# Patient Record
Sex: Female | Born: 1970
Health system: Southern US, Community
[De-identification: ages and names within clinical notes are randomized; demographics above are authoritative.]

## PROBLEM LIST (undated history)

## (undated) DIAGNOSIS — J302 Other seasonal allergic rhinitis: Secondary | ICD-10-CM

## (undated) DIAGNOSIS — E559 Vitamin D deficiency, unspecified: Secondary | ICD-10-CM

## (undated) DIAGNOSIS — T7840XA Allergy, unspecified, initial encounter: Secondary | ICD-10-CM

## (undated) DIAGNOSIS — J45909 Unspecified asthma, uncomplicated: Secondary | ICD-10-CM

## (undated) DIAGNOSIS — Z85828 Personal history of other malignant neoplasm of skin: Secondary | ICD-10-CM

## (undated) DIAGNOSIS — G4700 Insomnia, unspecified: Secondary | ICD-10-CM

## (undated) DIAGNOSIS — S76912A Strain of unspecified muscles, fascia and tendons at thigh level, left thigh, initial encounter: Secondary | ICD-10-CM

## (undated) DIAGNOSIS — I83891 Varicose veins of right lower extremities with other complications: Secondary | ICD-10-CM

## (undated) HISTORY — DX: Other seasonal allergic rhinitis: J30.2

## (undated) HISTORY — DX: Vitamin D deficiency, unspecified: E55.9

## (undated) HISTORY — DX: Varicose veins of right lower extremity with other complications: I83.891

## (undated) HISTORY — DX: Personal history of other malignant neoplasm of skin: Z85.828

## (undated) HISTORY — DX: Insomnia, unspecified: G47.00

## (undated) HISTORY — DX: Strain of unspecified muscles, fascia and tendons at thigh level, left thigh, initial encounter: S76.912A

## (undated) HISTORY — DX: Allergy, unspecified, initial encounter: T78.40XA

## (undated) HISTORY — DX: Unspecified asthma, uncomplicated: J45.909

---

## 2000-04-30 ENCOUNTER — Encounter: Payer: Self-pay | Admitting: Orthopedic Surgery

## 2000-04-30 ENCOUNTER — Ambulatory Visit (HOSPITAL_COMMUNITY): Admission: RE | Admit: 2000-04-30 | Discharge: 2000-04-30 | Payer: Self-pay | Admitting: Orthopedic Surgery

## 2002-06-15 ENCOUNTER — Inpatient Hospital Stay (HOSPITAL_COMMUNITY): Admission: AD | Admit: 2002-06-15 | Discharge: 2002-06-17 | Payer: Self-pay | Admitting: Obstetrics and Gynecology

## 2002-12-01 ENCOUNTER — Other Ambulatory Visit: Admission: RE | Admit: 2002-12-01 | Discharge: 2002-12-01 | Payer: Self-pay | Admitting: Obstetrics and Gynecology

## 2004-04-12 ENCOUNTER — Inpatient Hospital Stay (HOSPITAL_COMMUNITY): Admission: AD | Admit: 2004-04-12 | Discharge: 2004-04-15 | Payer: Self-pay | Admitting: Obstetrics and Gynecology

## 2004-05-26 ENCOUNTER — Other Ambulatory Visit: Admission: RE | Admit: 2004-05-26 | Discharge: 2004-05-26 | Payer: Self-pay | Admitting: Obstetrics and Gynecology

## 2004-10-31 ENCOUNTER — Ambulatory Visit (HOSPITAL_BASED_OUTPATIENT_CLINIC_OR_DEPARTMENT_OTHER): Admission: RE | Admit: 2004-10-31 | Discharge: 2004-10-31 | Payer: Self-pay | Admitting: Plastic Surgery

## 2004-10-31 ENCOUNTER — Ambulatory Visit (HOSPITAL_COMMUNITY): Admission: RE | Admit: 2004-10-31 | Discharge: 2004-10-31 | Payer: Self-pay | Admitting: Plastic Surgery

## 2005-06-29 ENCOUNTER — Other Ambulatory Visit: Admission: RE | Admit: 2005-06-29 | Discharge: 2005-06-29 | Payer: Self-pay | Admitting: Obstetrics and Gynecology

## 2012-09-23 ENCOUNTER — Other Ambulatory Visit: Payer: Self-pay

## 2014-01-12 ENCOUNTER — Ambulatory Visit
Admission: RE | Admit: 2014-01-12 | Discharge: 2014-01-12 | Disposition: A | Discharge status: Home / Self Care | Payer: 59 | Source: Ambulatory Visit | Attending: Allergy and Immunology | Admitting: Allergy and Immunology

## 2014-01-12 ENCOUNTER — Other Ambulatory Visit: Payer: Self-pay | Admitting: Allergy and Immunology

## 2014-01-12 DIAGNOSIS — J45990 Exercise induced bronchospasm: Secondary | ICD-10-CM

## 2014-07-07 ENCOUNTER — Ambulatory Visit (INDEPENDENT_AMBULATORY_CARE_PROVIDER_SITE_OTHER): Payer: 59 | Admitting: Podiatry

## 2014-07-07 ENCOUNTER — Encounter: Payer: Self-pay | Admitting: Podiatry

## 2014-07-07 ENCOUNTER — Ambulatory Visit (INDEPENDENT_AMBULATORY_CARE_PROVIDER_SITE_OTHER): Payer: 59

## 2014-07-07 VITALS — BP 108/69 | HR 63 | Resp 16

## 2014-07-07 DIAGNOSIS — M79671 Pain in right foot: Secondary | ICD-10-CM

## 2014-07-07 DIAGNOSIS — D3610 Benign neoplasm of peripheral nerves and autonomic nervous system, unspecified: Secondary | ICD-10-CM

## 2014-07-07 NOTE — Patient Instructions (Signed)
Pre-Operative Instructions  Congratulations, you have decided to take an important step to improving your quality of life.  You can be assured that the doctors of [facility] will be with you every step of the way.  1. Plan to be at the surgery center/hospital at least 1 (one) hour prior to your scheduled time unless otherwise directed by the surgical center/hospital staff.  You must have a responsible adult accompany you, remain during the surgery and drive you home.  Make sure you have directions to the surgical center/hospital and know how to get there on time. 2. For hospital based surgery you will need to obtain a history and physical form from your family physician within 1 month prior to the date of surgery- we will give you a form for you primary physician.  3. We make every effort to accommodate the date you request for surgery.  There are however, times where surgery dates or times have to be moved.  We will contact you as soon as possible if a change in schedule is required.   4. No Aspirin/Ibuprofen for one week before surgery.  If you are on aspirin, any non-steroidal anti-inflammatory medications (Mobic, Aleve, Ibuprofen) you should stop taking it 7 days prior to your surgery.  You make take Tylenol  For pain prior to surgery.  5. Medications- If you are taking daily heart and blood pressure medications, seizure, reflux, allergy, asthma, anxiety, pain or diabetes medications, make sure the surgery center/hospital is aware before the day of surgery so they may notify you which medications to take or avoid the day of surgery. 6. No food or drink after midnight the night before surgery unless directed otherwise by surgical center/hospital staff. 7. No alcoholic beverages 24 hours prior to surgery.  No smoking 24 hours prior to or 24 hours after surgery. 8. Wear loose pants or shorts- loose enough to fit over bandages, boots, and casts. 9. No slip on shoes, sneakers are best. 10. Bring  your boot with you to the surgery center/hospital.  Also bring crutches or a walker if your physician has prescribed it for you.  If you do not have this equipment, it will be provided for you after surgery. 11. If you have not been contracted by the surgery center/hospital by the day before your surgery, call to confirm the date and time of your surgery. 12. Leave-time from work may vary depending on the type of surgery you have.  Appropriate arrangements should be made prior to surgery with your employer. 13. Prescriptions will be provided immediately following surgery by your doctor.  Have these filled as soon as possible after surgery and take the medication as directed. 14. Remove nail polish on the operative foot. 15. Wash the night before surgery.  The night before surgery wash the foot and leg well with the antibacterial soap provided and water paying special attention to beneath the toenails and in between the toes.  Rinse thoroughly with water and dry well with a towel.  Perform this wash unless told not to do so by your physician.  Enclosed: 1 Ice pack (please put in freezer the night before surgery)   1 Hibiclens skin cleaner   Pre-op Instructions  If you have any questions regarding the instructions, do not hesitate to call our office.  [place]: [address] [place], [place] [address] [phone]  [place]: [address] [place], [place] [address] [phone]  [place]: [address]  [place], [place] [address] [phone]  Dr. Richard   Tuchman DPM, Dr. Norman Regal DPM Dr. Richard Sikora DPM, Dr. M. Todd Hyatt DPM, Dr. Kathryn Egerton DPM 

## 2014-07-07 NOTE — Progress Notes (Signed)
Subjective:     Patient ID: Jennifer Douglas, female   DOB: May 29, 1971, 43 y.o.   MRN: [id number]  HPI patient states that I get a lot of pain with this nerve on my right foot and that since he saw me 2-1/2 years ago it never got perfect but it really now is bothering me over the last 6 months   Review of Systems     Objective:   Physical Exam Neurovascular status intact with muscle strength adequate and range of motion within normal limits. Patient is found to have shooting pain when the third interspace right foot is pressed and the foot is squeezed with radiating discomfort into the third and fourth toes    Assessment:     Neuroma symptomatology right that has been troublesome for over 3 years    Plan:     H&P and reviewed x-rays with patient. I do think that long-term this would be best corrected and I reviewed that with Jennifer Douglas and she agrees with me. Today I did do a neuro lysis injection consisting of purified alcohol solution and Marcaine and I advised on surgery and allowed her to read a consent form for surgical correction. I explained all possible complications with surgery and reviewed the told recovery. Can take up to 6 months and there is no long-term guarantees and she will most likely have a small amount of numbness between the third and fourth toes area patient understands all of this wants surgery and signs consent form and will call to schedule in January area she is encouraged to call us with further questions

## 2014-07-14 ENCOUNTER — Telehealth: Payer: Self-pay | Admitting: *Deleted

## 2014-07-14 NOTE — Telephone Encounter (Signed)
Pt request insurance information concerning her upcoming neuroma surgery.

## 2015-11-22 ENCOUNTER — Ambulatory Visit
Admission: RE | Admit: 2015-11-22 | Discharge: 2015-11-22 | Disposition: A | Discharge status: Home / Self Care | Payer: 59 | Source: Ambulatory Visit | Attending: Family Medicine | Admitting: Family Medicine

## 2015-11-22 ENCOUNTER — Other Ambulatory Visit: Payer: Self-pay | Admitting: Family Medicine

## 2015-11-22 DIAGNOSIS — M419 Scoliosis, unspecified: Secondary | ICD-10-CM

## 2016-08-01 DIAGNOSIS — J301 Allergic rhinitis due to pollen: Secondary | ICD-10-CM | POA: Diagnosis not present

## 2016-08-01 DIAGNOSIS — J3089 Other allergic rhinitis: Secondary | ICD-10-CM | POA: Diagnosis not present

## 2016-08-01 DIAGNOSIS — C44719 Basal cell carcinoma of skin of left lower limb, including hip: Secondary | ICD-10-CM | POA: Diagnosis not present

## 2016-08-01 DIAGNOSIS — J3081 Allergic rhinitis due to animal (cat) (dog) hair and dander: Secondary | ICD-10-CM | POA: Diagnosis not present

## 2016-08-10 DIAGNOSIS — J301 Allergic rhinitis due to pollen: Secondary | ICD-10-CM | POA: Diagnosis not present

## 2016-08-10 DIAGNOSIS — J3081 Allergic rhinitis due to animal (cat) (dog) hair and dander: Secondary | ICD-10-CM | POA: Diagnosis not present

## 2016-08-10 DIAGNOSIS — J3089 Other allergic rhinitis: Secondary | ICD-10-CM | POA: Diagnosis not present

## 2016-08-15 DIAGNOSIS — J3089 Other allergic rhinitis: Secondary | ICD-10-CM | POA: Diagnosis not present

## 2016-08-15 DIAGNOSIS — J301 Allergic rhinitis due to pollen: Secondary | ICD-10-CM | POA: Diagnosis not present

## 2016-08-15 DIAGNOSIS — J3081 Allergic rhinitis due to animal (cat) (dog) hair and dander: Secondary | ICD-10-CM | POA: Diagnosis not present

## 2016-08-26 DIAGNOSIS — J00 Acute nasopharyngitis [common cold]: Secondary | ICD-10-CM | POA: Diagnosis not present

## 2016-08-30 DIAGNOSIS — J45990 Exercise induced bronchospasm: Secondary | ICD-10-CM | POA: Diagnosis not present

## 2016-08-30 DIAGNOSIS — J3081 Allergic rhinitis due to animal (cat) (dog) hair and dander: Secondary | ICD-10-CM | POA: Diagnosis not present

## 2016-08-30 DIAGNOSIS — J3089 Other allergic rhinitis: Secondary | ICD-10-CM | POA: Diagnosis not present

## 2016-08-30 DIAGNOSIS — J301 Allergic rhinitis due to pollen: Secondary | ICD-10-CM | POA: Diagnosis not present

## 2016-09-20 DIAGNOSIS — J3081 Allergic rhinitis due to animal (cat) (dog) hair and dander: Secondary | ICD-10-CM | POA: Diagnosis not present

## 2016-09-20 DIAGNOSIS — J301 Allergic rhinitis due to pollen: Secondary | ICD-10-CM | POA: Diagnosis not present

## 2016-09-20 DIAGNOSIS — J3089 Other allergic rhinitis: Secondary | ICD-10-CM | POA: Diagnosis not present

## 2016-09-29 DIAGNOSIS — J3081 Allergic rhinitis due to animal (cat) (dog) hair and dander: Secondary | ICD-10-CM | POA: Diagnosis not present

## 2016-09-29 DIAGNOSIS — J3089 Other allergic rhinitis: Secondary | ICD-10-CM | POA: Diagnosis not present

## 2016-09-29 DIAGNOSIS — J301 Allergic rhinitis due to pollen: Secondary | ICD-10-CM | POA: Diagnosis not present

## 2016-10-11 DIAGNOSIS — J3089 Other allergic rhinitis: Secondary | ICD-10-CM | POA: Diagnosis not present

## 2016-10-11 DIAGNOSIS — J3081 Allergic rhinitis due to animal (cat) (dog) hair and dander: Secondary | ICD-10-CM | POA: Diagnosis not present

## 2016-10-11 DIAGNOSIS — J301 Allergic rhinitis due to pollen: Secondary | ICD-10-CM | POA: Diagnosis not present

## 2016-10-17 DIAGNOSIS — J301 Allergic rhinitis due to pollen: Secondary | ICD-10-CM | POA: Diagnosis not present

## 2016-10-17 DIAGNOSIS — J3081 Allergic rhinitis due to animal (cat) (dog) hair and dander: Secondary | ICD-10-CM | POA: Diagnosis not present

## 2016-10-17 DIAGNOSIS — J3089 Other allergic rhinitis: Secondary | ICD-10-CM | POA: Diagnosis not present

## 2016-10-26 DIAGNOSIS — J3089 Other allergic rhinitis: Secondary | ICD-10-CM | POA: Diagnosis not present

## 2016-10-26 DIAGNOSIS — J301 Allergic rhinitis due to pollen: Secondary | ICD-10-CM | POA: Diagnosis not present

## 2016-10-26 DIAGNOSIS — J45990 Exercise induced bronchospasm: Secondary | ICD-10-CM | POA: Diagnosis not present

## 2016-10-26 DIAGNOSIS — R05 Cough: Secondary | ICD-10-CM | POA: Diagnosis not present

## 2016-11-06 DIAGNOSIS — J3089 Other allergic rhinitis: Secondary | ICD-10-CM | POA: Diagnosis not present

## 2016-11-06 DIAGNOSIS — J3081 Allergic rhinitis due to animal (cat) (dog) hair and dander: Secondary | ICD-10-CM | POA: Diagnosis not present

## 2016-11-06 DIAGNOSIS — J301 Allergic rhinitis due to pollen: Secondary | ICD-10-CM | POA: Diagnosis not present

## 2016-12-04 DIAGNOSIS — J301 Allergic rhinitis due to pollen: Secondary | ICD-10-CM | POA: Diagnosis not present

## 2016-12-04 DIAGNOSIS — J3089 Other allergic rhinitis: Secondary | ICD-10-CM | POA: Diagnosis not present

## 2016-12-04 DIAGNOSIS — J3081 Allergic rhinitis due to animal (cat) (dog) hair and dander: Secondary | ICD-10-CM | POA: Diagnosis not present

## 2016-12-05 DIAGNOSIS — L570 Actinic keratosis: Secondary | ICD-10-CM | POA: Diagnosis not present

## 2016-12-05 DIAGNOSIS — Z808 Family history of malignant neoplasm of other organs or systems: Secondary | ICD-10-CM | POA: Diagnosis not present

## 2016-12-05 DIAGNOSIS — Z86018 Personal history of other benign neoplasm: Secondary | ICD-10-CM | POA: Diagnosis not present

## 2016-12-05 DIAGNOSIS — D1800 Hemangioma unspecified site: Secondary | ICD-10-CM | POA: Diagnosis not present

## 2016-12-07 DIAGNOSIS — G4700 Insomnia, unspecified: Secondary | ICD-10-CM | POA: Diagnosis not present

## 2016-12-18 ENCOUNTER — Other Ambulatory Visit: Payer: Self-pay

## 2016-12-18 DIAGNOSIS — I8393 Asymptomatic varicose veins of bilateral lower extremities: Secondary | ICD-10-CM

## 2016-12-18 DIAGNOSIS — J3089 Other allergic rhinitis: Secondary | ICD-10-CM | POA: Diagnosis not present

## 2016-12-18 DIAGNOSIS — J3081 Allergic rhinitis due to animal (cat) (dog) hair and dander: Secondary | ICD-10-CM | POA: Diagnosis not present

## 2016-12-18 DIAGNOSIS — J301 Allergic rhinitis due to pollen: Secondary | ICD-10-CM | POA: Diagnosis not present

## 2016-12-22 DIAGNOSIS — J301 Allergic rhinitis due to pollen: Secondary | ICD-10-CM | POA: Diagnosis not present

## 2016-12-22 DIAGNOSIS — J3081 Allergic rhinitis due to animal (cat) (dog) hair and dander: Secondary | ICD-10-CM | POA: Diagnosis not present

## 2016-12-22 DIAGNOSIS — J3089 Other allergic rhinitis: Secondary | ICD-10-CM | POA: Diagnosis not present

## 2016-12-26 DIAGNOSIS — J3081 Allergic rhinitis due to animal (cat) (dog) hair and dander: Secondary | ICD-10-CM | POA: Diagnosis not present

## 2016-12-26 DIAGNOSIS — J301 Allergic rhinitis due to pollen: Secondary | ICD-10-CM | POA: Diagnosis not present

## 2016-12-26 DIAGNOSIS — J3089 Other allergic rhinitis: Secondary | ICD-10-CM | POA: Diagnosis not present

## 2017-01-02 DIAGNOSIS — J3089 Other allergic rhinitis: Secondary | ICD-10-CM | POA: Diagnosis not present

## 2017-01-02 DIAGNOSIS — J301 Allergic rhinitis due to pollen: Secondary | ICD-10-CM | POA: Diagnosis not present

## 2017-01-02 DIAGNOSIS — J3081 Allergic rhinitis due to animal (cat) (dog) hair and dander: Secondary | ICD-10-CM | POA: Diagnosis not present

## 2017-01-09 DIAGNOSIS — J3081 Allergic rhinitis due to animal (cat) (dog) hair and dander: Secondary | ICD-10-CM | POA: Diagnosis not present

## 2017-01-09 DIAGNOSIS — J3089 Other allergic rhinitis: Secondary | ICD-10-CM | POA: Diagnosis not present

## 2017-01-09 DIAGNOSIS — J301 Allergic rhinitis due to pollen: Secondary | ICD-10-CM | POA: Diagnosis not present

## 2017-01-12 DIAGNOSIS — L309 Dermatitis, unspecified: Secondary | ICD-10-CM | POA: Diagnosis not present

## 2017-01-16 DIAGNOSIS — J301 Allergic rhinitis due to pollen: Secondary | ICD-10-CM | POA: Diagnosis not present

## 2017-01-16 DIAGNOSIS — J3081 Allergic rhinitis due to animal (cat) (dog) hair and dander: Secondary | ICD-10-CM | POA: Diagnosis not present

## 2017-01-16 DIAGNOSIS — J3089 Other allergic rhinitis: Secondary | ICD-10-CM | POA: Diagnosis not present

## 2017-01-29 ENCOUNTER — Encounter: Payer: Self-pay | Admitting: Vascular Surgery

## 2017-02-01 DIAGNOSIS — J3081 Allergic rhinitis due to animal (cat) (dog) hair and dander: Secondary | ICD-10-CM | POA: Diagnosis not present

## 2017-02-01 DIAGNOSIS — J301 Allergic rhinitis due to pollen: Secondary | ICD-10-CM | POA: Diagnosis not present

## 2017-02-01 DIAGNOSIS — J3089 Other allergic rhinitis: Secondary | ICD-10-CM | POA: Diagnosis not present

## 2017-02-08 ENCOUNTER — Ambulatory Visit (HOSPITAL_COMMUNITY)
Admission: RE | Admit: 2017-02-08 | Discharge: 2017-02-08 | Disposition: A | Discharge status: Home / Self Care | Payer: 59 | Source: Ambulatory Visit | Attending: Vascular Surgery | Admitting: Vascular Surgery

## 2017-02-08 ENCOUNTER — Encounter: Payer: Self-pay | Admitting: Vascular Surgery

## 2017-02-08 ENCOUNTER — Ambulatory Visit (INDEPENDENT_AMBULATORY_CARE_PROVIDER_SITE_OTHER): Payer: 59 | Admitting: Vascular Surgery

## 2017-02-08 VITALS — BP 121/76 | HR 70 | Temp 98.3°F | Resp 14 | Ht 62.0 in | Wt 127.0 lb

## 2017-02-08 DIAGNOSIS — I8393 Asymptomatic varicose veins of bilateral lower extremities: Secondary | ICD-10-CM | POA: Diagnosis not present

## 2017-02-08 DIAGNOSIS — I872 Venous insufficiency (chronic) (peripheral): Secondary | ICD-10-CM | POA: Insufficient documentation

## 2017-02-08 NOTE — Progress Notes (Signed)
Patient name: Jennifer Douglas MRN: [id number] DOB: 01/29/1971 Sex: female   REASON FOR CONSULT:    Varicose vein. Self-referral.  HPI:   Jennifer Douglas is a pleasant 46 y.o. female,  Who noticed a bulging varicose vein on the posterior aspect of her right thigh. She presents for vascular evaluation. The patient denies any previous history of DVT or phlebitis. She is unaware of any family history of varicose veins. She has noticed some small spider veins in both lower extremities. She experiences some aching pain in her legs which is aggravated by standing and sitting. She has not had any significant swelling. She does not wear compression stockings.  She is otherwise very healthy except for history of asthma.  Past Medical History:  Diagnosis Date  . History of basal cell cancer    Right forearm (Dr Delman Cheadle)  . Insomnia   . Rupture of left rectus femoris tendon    10/2008 s/p Pt   . Seasonal allergies    Dr Criselda Peaches   . Varicose veins of right leg with edema   . Vitamin D deficiency    05/2009    Family History  Problem Relation Age of Onset  . Depression Mother   . Osteoporosis Mother   . Migraines Mother   . CVA Paternal Grandmother        tachycardia  . Arrhythmia Paternal Grandmother     SOCIAL HISTORY: Social History   Social History  . Marital status: Married    Spouse name: N/A  . Number of children: N/A  . Years of education: N/A   Occupational History  . Not on file.   Social History Main Topics  . Smoking status: Never Smoker  . Smokeless tobacco: Former Systems developer  . Alcohol use 0.6 oz/week    1 Cans of beer per week  . Drug use: No  . Sexual activity: Not on file   Other Topics Concern  . 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 No Known Allergies     Current Outpatient Prescriptions  Medication Sig Dispense Refill  . Calcium-Vitamin D (CALTRATE 600 PLUS-VIT D PO) Take by mouth.    . mometasone (ASMANEX) 220 MCG/INH inhaler Inhale 2 puffs into the lungs daily.    . Montelukast Sodium (SINGULAIR PO) Take by mouth.    . ALPRAZolam (XANAX) 0.25 MG tablet Take 0.25 mg by mouth at bedtime as needed for anxiety.    . diphenhydrAMINE (SOMINEX) 25 MG tablet Take 25 mg by mouth at bedtime as needed for sleep.    . eszopiclone (LUNESTA) 1 MG TABS tablet Take 1 mg by mouth at bedtime as needed for sleep. Take immediately before bedtime    . mometasone (NASONEX) 50 MCG/ACT nasal spray Place 2 sprays into the nose daily.    . Olopatadine HCl (PATADAY OP) Apply to eye. 1 drop in affected eye once a day     No current facility-administered medications for this visit.     REVIEW OF SYSTEMS:  [X]   denotes positive finding, [ ]  denotes negative finding Cardiac  Comments:  Chest pain or chest pressure:    Shortness of breath upon exertion:    Short of breath when lying flat:    Irregular heart rhythm:        Vascular    Pain in calf, thigh, or hip brought on by ambulation:    Pain in feet at night that wakes you up from your sleep:     Blood clot in your veins:    Leg swelling:         Pulmonary    Oxygen at home:    Productive cough:     Wheezing:         Neurologic    Sudden weakness in arms or legs:     Sudden numbness in arms or legs:     Sudden onset of difficulty speaking or slurred speech:    Temporary loss of vision in one eye:     Problems with dizziness:         Gastrointestinal    Blood in stool:     Vomited blood:         Genitourinary    Burning when urinating:     Blood in urine:        Psychiatric      Major depression:         Hematologic    Bleeding problems:    Problems with blood clotting too easily:        Skin    Rashes or ulcers:        Constitutional    Fever or chills:     PHYSICAL EXAM:   Vitals:   02/08/17 1623  BP: 121/76  Pulse: 70  Resp: 14  Temp: 98.3 F (36.8 C)  SpO2: 100%  Weight: 127 lb (57.6 kg)  Height: 5\' 2"  (1.575 m)    GENERAL: The patient is a well-nourished female, in no acute distress. The vital signs are documented above. CARDIAC: There is a regular rate and rhythm.  VASCULAR: I do not detect carotid bruits. She has palpable femoral, popliteal, and posterior tibial pulses bilaterally. She has some very small spider veins of both lower extremities and one larger varicose vein in the posterior aspect of her right thigh. There appears to be a fascial defect beneath this suggesting that this might be an incompetent perforating vein. PULMONARY: There is good air exchange bilaterally without wheezing or rales. ABDOMEN: Soft and non-tender with normal pitched bowel sounds.  MUSCULOSKELETAL: There are no major deformities or cyanosis. NEUROLOGIC: No focal weakness or paresthesias are detected. SKIN: There are no ulcers or rashes noted. PSYCHIATRIC: The patient has a normal affect.  DATA:    BILATERAL LOWER EXTREMITY VENOUS DUPLEX: I have independently interpreted her bilateral lower extremity venous duplex scan.  On the right side there is no evidence of DVT or superficial thrombophlebitis. There is deep venous reflux involving the right common femoral vein and popliteal vein. There is no superficial venous reflux.  On the left side, there is no evidence of DVT or superficial thrombophlebitis. There is deep venous reflux involving the common femoral vein. There is no superficial venous reflux.  MEDICAL ISSUES:   CHRONIC VENOUS INSUFFICIENCY: This patient has venous insufficiency that involves the deep system only. She has no significant  superficial venous reflux. I have discussed with her the importance of intermittent leg elevation and the proper positioning for this. I have written her a prescription for  knee-high compression stockings with a gradient of 15-20 mmHg. I have encouraged her to avoid prolonged sitting and standing and to exercise as much as possible. I also discussed water aerobics which is very helpful for people with venous insufficiency. If her varicose veins progress or she develops significant symptoms then we can reevaluate her in the future with a follow up venous duplex scan. She knows to call if her symptoms progress.  Deitra Mayo [facility] [phone]

## 2017-02-27 DIAGNOSIS — J3081 Allergic rhinitis due to animal (cat) (dog) hair and dander: Secondary | ICD-10-CM | POA: Diagnosis not present

## 2017-02-27 DIAGNOSIS — J3089 Other allergic rhinitis: Secondary | ICD-10-CM | POA: Diagnosis not present

## 2017-02-27 DIAGNOSIS — J301 Allergic rhinitis due to pollen: Secondary | ICD-10-CM | POA: Diagnosis not present

## 2017-03-13 DIAGNOSIS — J301 Allergic rhinitis due to pollen: Secondary | ICD-10-CM | POA: Diagnosis not present

## 2017-03-13 DIAGNOSIS — J3089 Other allergic rhinitis: Secondary | ICD-10-CM | POA: Diagnosis not present

## 2017-03-13 DIAGNOSIS — J3081 Allergic rhinitis due to animal (cat) (dog) hair and dander: Secondary | ICD-10-CM | POA: Diagnosis not present

## 2017-03-29 DIAGNOSIS — J301 Allergic rhinitis due to pollen: Secondary | ICD-10-CM | POA: Diagnosis not present

## 2017-03-29 DIAGNOSIS — J3089 Other allergic rhinitis: Secondary | ICD-10-CM | POA: Diagnosis not present

## 2017-03-29 DIAGNOSIS — J3081 Allergic rhinitis due to animal (cat) (dog) hair and dander: Secondary | ICD-10-CM | POA: Diagnosis not present

## 2017-04-03 DIAGNOSIS — Z1231 Encounter for screening mammogram for malignant neoplasm of breast: Secondary | ICD-10-CM | POA: Diagnosis not present

## 2017-04-03 DIAGNOSIS — Z01419 Encounter for gynecological examination (general) (routine) without abnormal findings: Secondary | ICD-10-CM | POA: Diagnosis not present

## 2017-04-09 DIAGNOSIS — G4700 Insomnia, unspecified: Secondary | ICD-10-CM | POA: Diagnosis not present

## 2017-04-09 DIAGNOSIS — J3089 Other allergic rhinitis: Secondary | ICD-10-CM | POA: Diagnosis not present

## 2017-04-09 DIAGNOSIS — J3081 Allergic rhinitis due to animal (cat) (dog) hair and dander: Secondary | ICD-10-CM | POA: Diagnosis not present

## 2017-04-09 DIAGNOSIS — J301 Allergic rhinitis due to pollen: Secondary | ICD-10-CM | POA: Diagnosis not present

## 2017-04-09 DIAGNOSIS — R238 Other skin changes: Secondary | ICD-10-CM | POA: Diagnosis not present

## 2017-04-23 DIAGNOSIS — J301 Allergic rhinitis due to pollen: Secondary | ICD-10-CM | POA: Diagnosis not present

## 2017-04-23 DIAGNOSIS — J45990 Exercise induced bronchospasm: Secondary | ICD-10-CM | POA: Diagnosis not present

## 2017-04-23 DIAGNOSIS — J3081 Allergic rhinitis due to animal (cat) (dog) hair and dander: Secondary | ICD-10-CM | POA: Diagnosis not present

## 2017-04-23 DIAGNOSIS — J3089 Other allergic rhinitis: Secondary | ICD-10-CM | POA: Diagnosis not present

## 2017-04-30 DIAGNOSIS — J3089 Other allergic rhinitis: Secondary | ICD-10-CM | POA: Diagnosis not present

## 2017-04-30 DIAGNOSIS — J3081 Allergic rhinitis due to animal (cat) (dog) hair and dander: Secondary | ICD-10-CM | POA: Diagnosis not present

## 2017-04-30 DIAGNOSIS — J301 Allergic rhinitis due to pollen: Secondary | ICD-10-CM | POA: Diagnosis not present

## 2017-05-22 DIAGNOSIS — J3089 Other allergic rhinitis: Secondary | ICD-10-CM | POA: Diagnosis not present

## 2017-05-22 DIAGNOSIS — J3081 Allergic rhinitis due to animal (cat) (dog) hair and dander: Secondary | ICD-10-CM | POA: Diagnosis not present

## 2017-05-22 DIAGNOSIS — J301 Allergic rhinitis due to pollen: Secondary | ICD-10-CM | POA: Diagnosis not present

## 2017-05-24 DIAGNOSIS — M7541 Impingement syndrome of right shoulder: Secondary | ICD-10-CM | POA: Diagnosis not present

## 2017-05-24 DIAGNOSIS — M7521 Bicipital tendinitis, right shoulder: Secondary | ICD-10-CM | POA: Diagnosis not present

## 2017-05-28 DIAGNOSIS — J3081 Allergic rhinitis due to animal (cat) (dog) hair and dander: Secondary | ICD-10-CM | POA: Diagnosis not present

## 2017-05-28 DIAGNOSIS — J301 Allergic rhinitis due to pollen: Secondary | ICD-10-CM | POA: Diagnosis not present

## 2017-05-29 DIAGNOSIS — J3089 Other allergic rhinitis: Secondary | ICD-10-CM | POA: Diagnosis not present

## 2017-06-06 DIAGNOSIS — M7521 Bicipital tendinitis, right shoulder: Secondary | ICD-10-CM | POA: Diagnosis not present

## 2017-06-06 DIAGNOSIS — M7541 Impingement syndrome of right shoulder: Secondary | ICD-10-CM | POA: Diagnosis not present

## 2017-06-12 DIAGNOSIS — M7521 Bicipital tendinitis, right shoulder: Secondary | ICD-10-CM | POA: Diagnosis not present

## 2017-06-12 DIAGNOSIS — M7541 Impingement syndrome of right shoulder: Secondary | ICD-10-CM | POA: Diagnosis not present

## 2017-06-19 DIAGNOSIS — J3089 Other allergic rhinitis: Secondary | ICD-10-CM | POA: Diagnosis not present

## 2017-06-19 DIAGNOSIS — J3081 Allergic rhinitis due to animal (cat) (dog) hair and dander: Secondary | ICD-10-CM | POA: Diagnosis not present

## 2017-06-19 DIAGNOSIS — J301 Allergic rhinitis due to pollen: Secondary | ICD-10-CM | POA: Diagnosis not present

## 2017-06-25 DIAGNOSIS — M7541 Impingement syndrome of right shoulder: Secondary | ICD-10-CM | POA: Diagnosis not present

## 2017-06-26 IMAGING — CR DG SCOLIOSIS EVAL COMPLETE SPINE 1V
1 series · 3 of 3 positions shown · non-contrast
Comparison: 01/12/2014

CLINICAL DATA: Scoliosis evaluation.

EXAM:
DG SCOLIOSIS EVAL COMPLETE SPINE 1V

[Series 1001: view not recorded · 0.40mm/px · 3 of 3 slices shown]
[im 1/3]
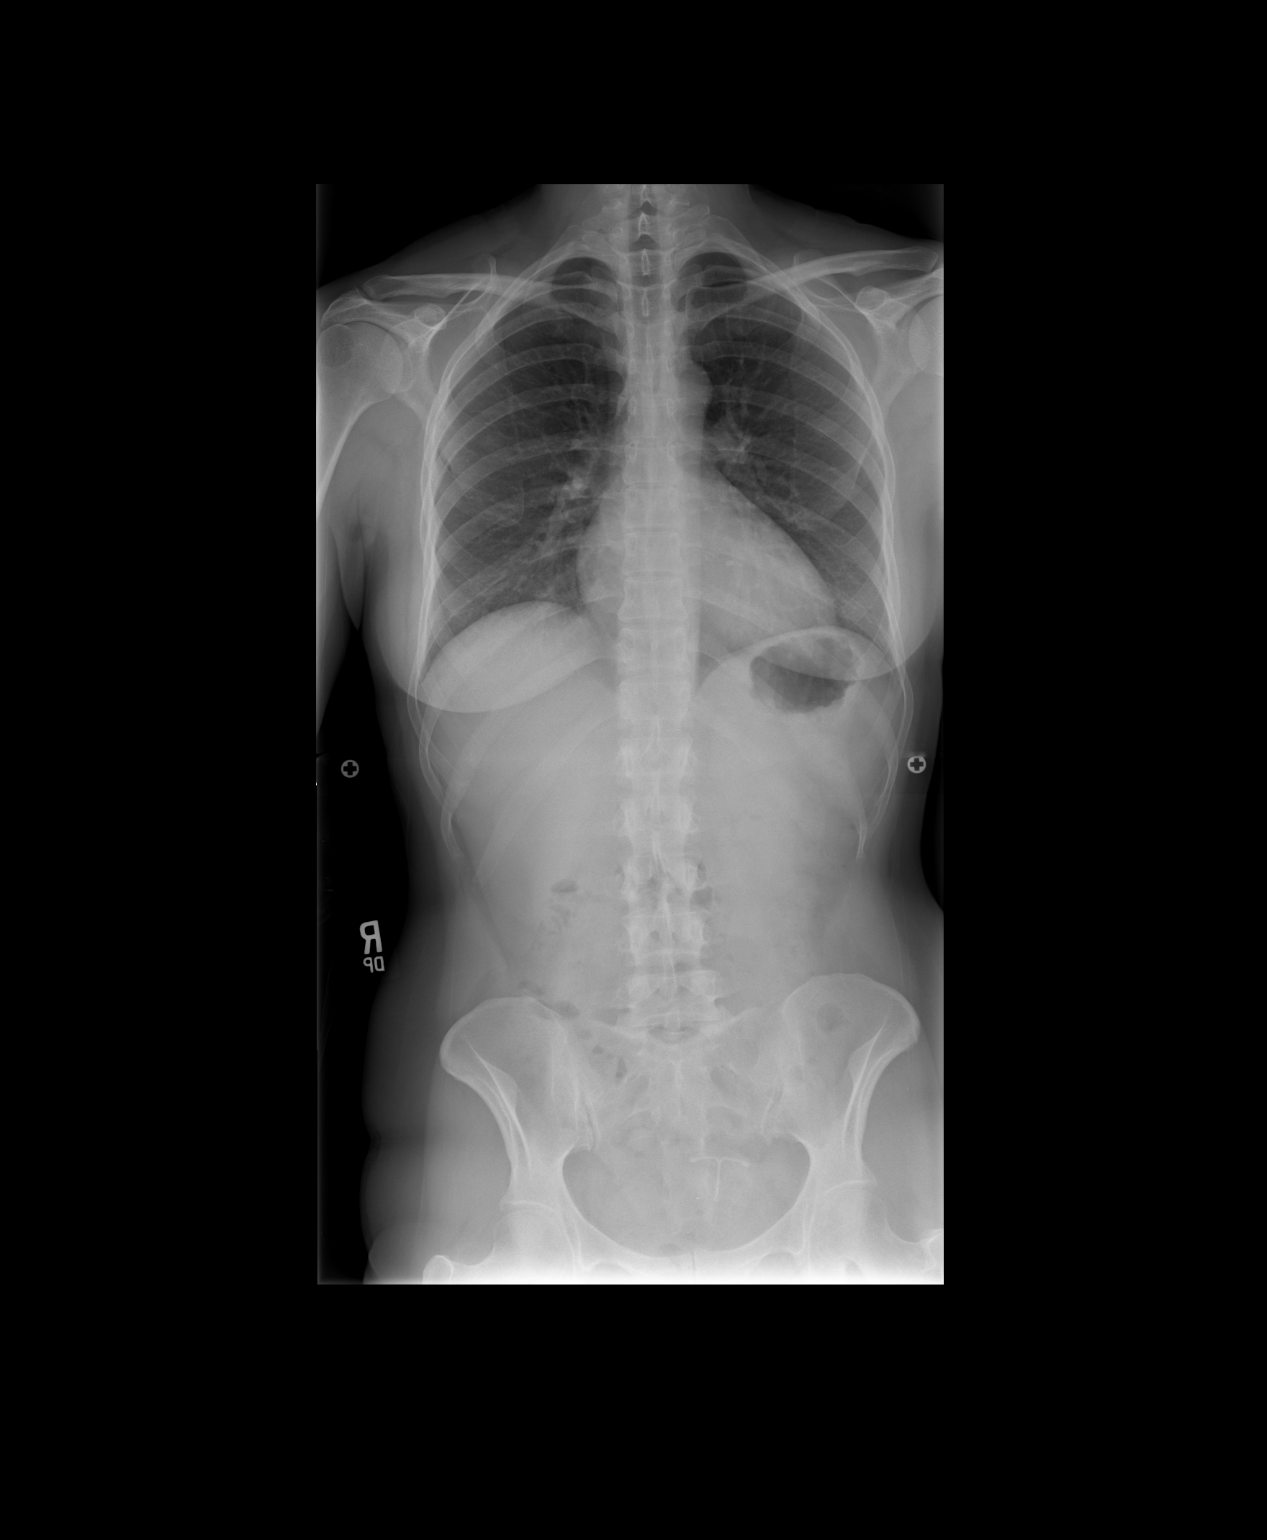
[im 2/3]
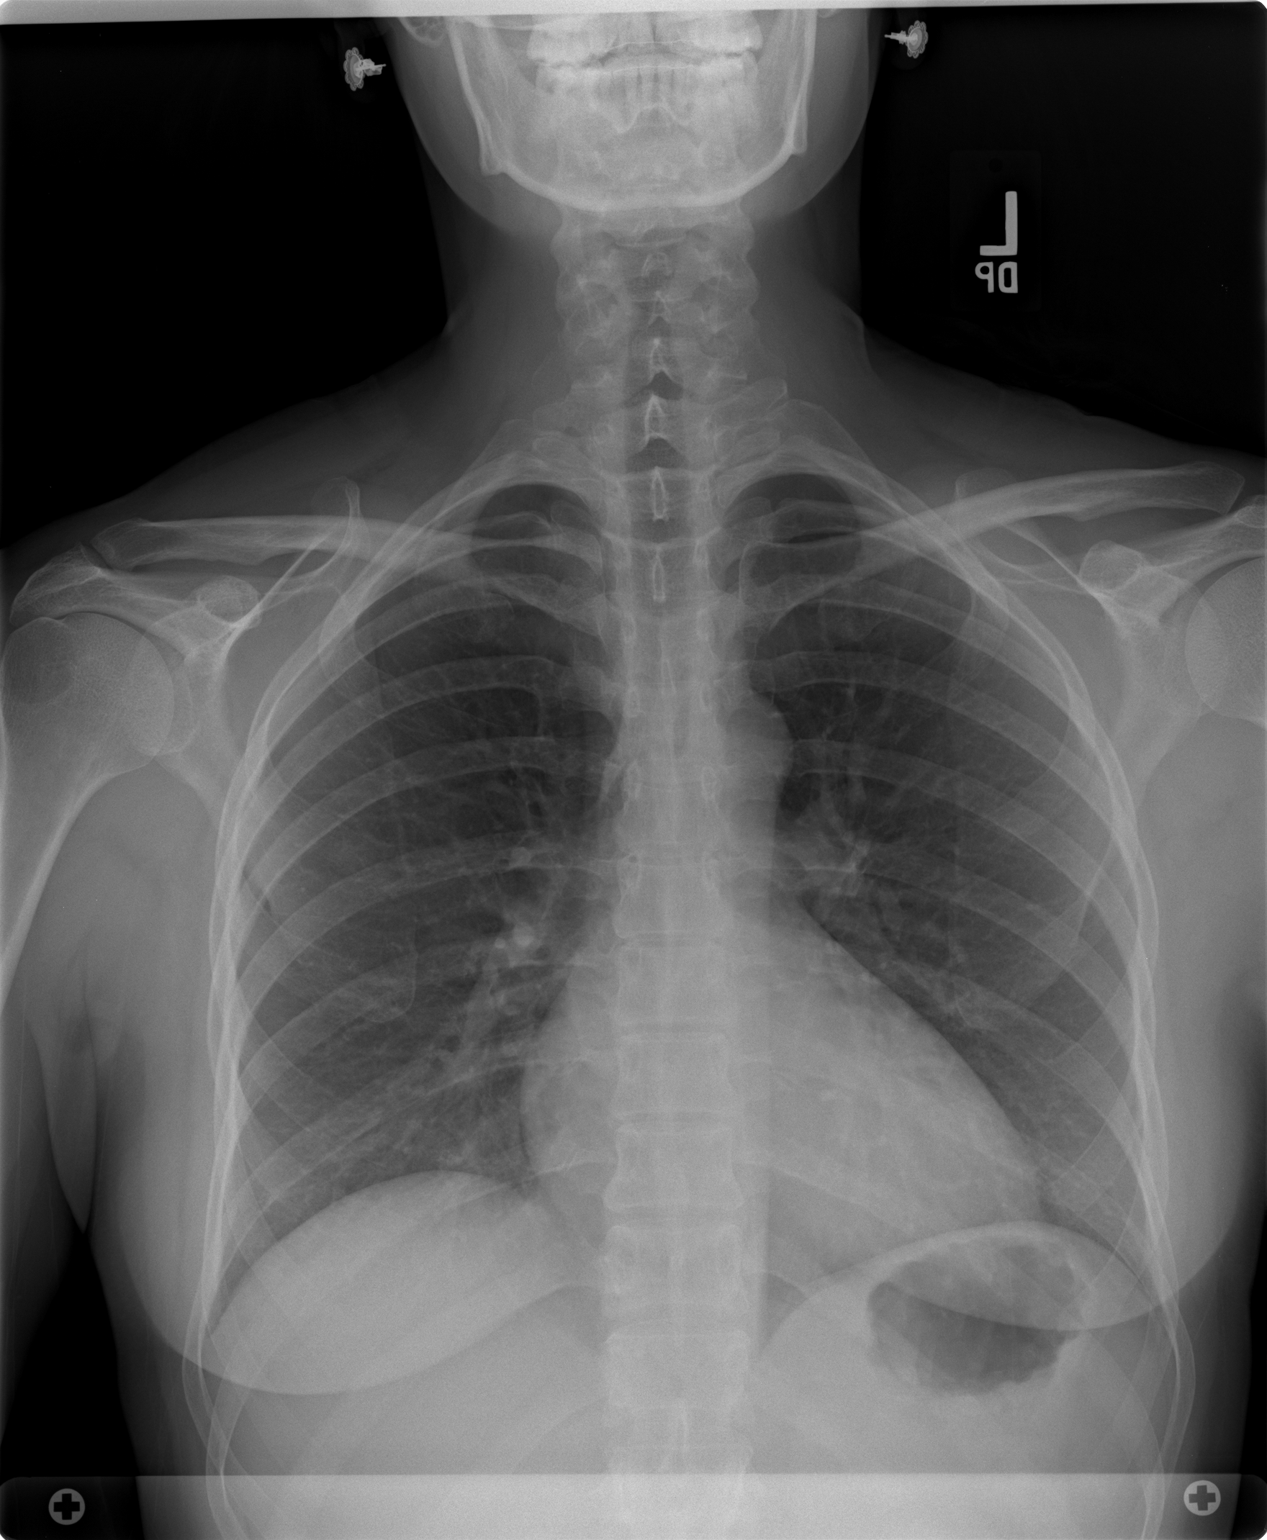
[im 3/3]
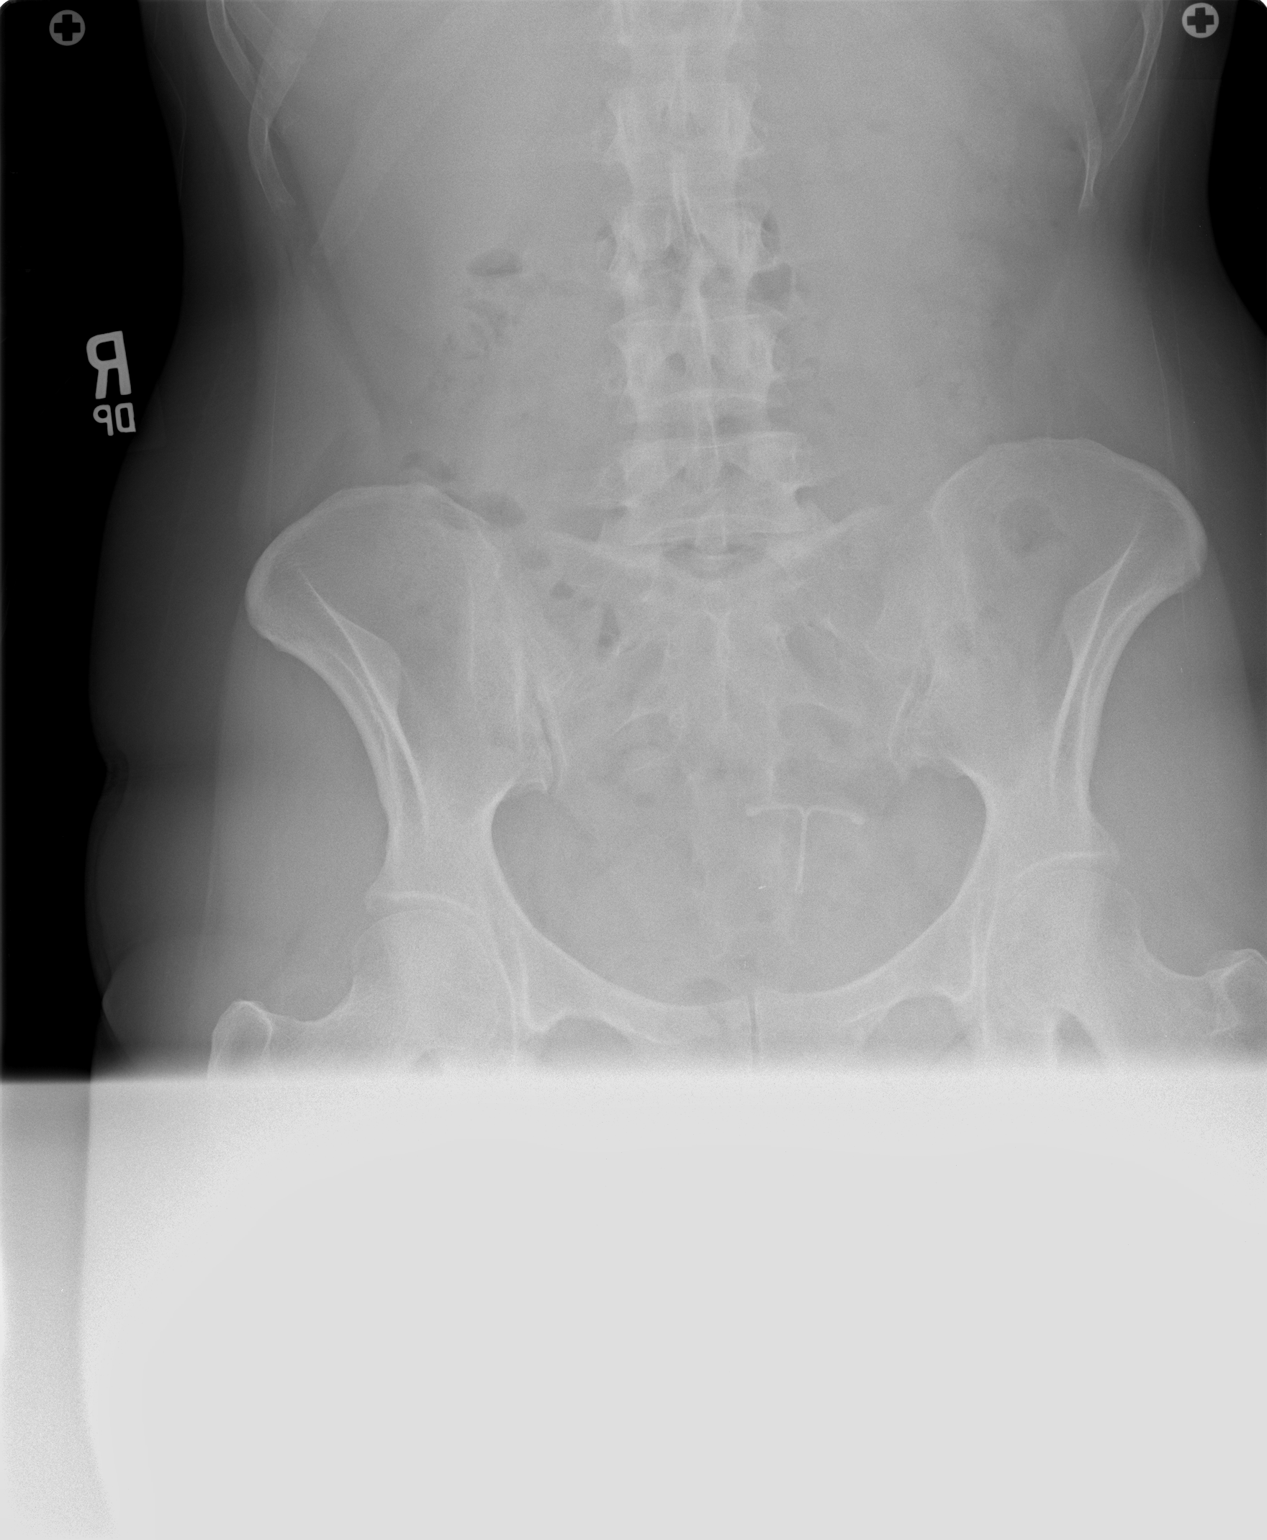

[3 of 3 positions shown; findings below may reference images not displayed]

FINDINGS: There is a slight lumbar scoliosis centered at L2 with convexity to
the right of only 4 degrees. There is a pelvic tilt. The superior
aspect of the left femoral head is 10.8 mm higher than the superior
aspect of the right femoral head. Does the patient have a leg length
discrepancy?
IMPRESSION: Minimal lumbar scoliosis. Pelvic tilt. The possibility of leg length
discrepancy should be considered.

## 2017-07-25 DIAGNOSIS — M7542 Impingement syndrome of left shoulder: Secondary | ICD-10-CM | POA: Diagnosis not present

## 2017-07-25 DIAGNOSIS — M7541 Impingement syndrome of right shoulder: Secondary | ICD-10-CM | POA: Diagnosis not present

## 2017-08-06 DIAGNOSIS — J301 Allergic rhinitis due to pollen: Secondary | ICD-10-CM | POA: Diagnosis not present

## 2017-08-06 DIAGNOSIS — J3089 Other allergic rhinitis: Secondary | ICD-10-CM | POA: Diagnosis not present

## 2017-08-06 DIAGNOSIS — J45990 Exercise induced bronchospasm: Secondary | ICD-10-CM | POA: Diagnosis not present

## 2017-08-06 DIAGNOSIS — R05 Cough: Secondary | ICD-10-CM | POA: Diagnosis not present

## 2017-09-11 DIAGNOSIS — Z23 Encounter for immunization: Secondary | ICD-10-CM | POA: Diagnosis not present

## 2017-09-11 DIAGNOSIS — Z111 Encounter for screening for respiratory tuberculosis: Secondary | ICD-10-CM | POA: Diagnosis not present

## 2017-09-17 DIAGNOSIS — Z111 Encounter for screening for respiratory tuberculosis: Secondary | ICD-10-CM | POA: Diagnosis not present

## 2017-10-02 DIAGNOSIS — J301 Allergic rhinitis due to pollen: Secondary | ICD-10-CM | POA: Diagnosis not present

## 2017-10-02 DIAGNOSIS — J3081 Allergic rhinitis due to animal (cat) (dog) hair and dander: Secondary | ICD-10-CM | POA: Diagnosis not present

## 2017-10-02 DIAGNOSIS — J3089 Other allergic rhinitis: Secondary | ICD-10-CM | POA: Diagnosis not present

## 2017-10-16 DIAGNOSIS — J3081 Allergic rhinitis due to animal (cat) (dog) hair and dander: Secondary | ICD-10-CM | POA: Diagnosis not present

## 2017-10-16 DIAGNOSIS — J301 Allergic rhinitis due to pollen: Secondary | ICD-10-CM | POA: Diagnosis not present

## 2017-10-16 DIAGNOSIS — J3089 Other allergic rhinitis: Secondary | ICD-10-CM | POA: Diagnosis not present

## 2017-10-23 DIAGNOSIS — J3089 Other allergic rhinitis: Secondary | ICD-10-CM | POA: Diagnosis not present

## 2017-10-23 DIAGNOSIS — J3081 Allergic rhinitis due to animal (cat) (dog) hair and dander: Secondary | ICD-10-CM | POA: Diagnosis not present

## 2017-10-23 DIAGNOSIS — J301 Allergic rhinitis due to pollen: Secondary | ICD-10-CM | POA: Diagnosis not present

## 2017-11-02 DIAGNOSIS — J3081 Allergic rhinitis due to animal (cat) (dog) hair and dander: Secondary | ICD-10-CM | POA: Diagnosis not present

## 2017-11-02 DIAGNOSIS — J3089 Other allergic rhinitis: Secondary | ICD-10-CM | POA: Diagnosis not present

## 2017-11-02 DIAGNOSIS — J301 Allergic rhinitis due to pollen: Secondary | ICD-10-CM | POA: Diagnosis not present

## 2017-11-05 DIAGNOSIS — J301 Allergic rhinitis due to pollen: Secondary | ICD-10-CM | POA: Diagnosis not present

## 2017-11-05 DIAGNOSIS — J3089 Other allergic rhinitis: Secondary | ICD-10-CM | POA: Diagnosis not present

## 2017-11-05 DIAGNOSIS — J3081 Allergic rhinitis due to animal (cat) (dog) hair and dander: Secondary | ICD-10-CM | POA: Diagnosis not present

## 2017-11-14 DIAGNOSIS — J3081 Allergic rhinitis due to animal (cat) (dog) hair and dander: Secondary | ICD-10-CM | POA: Diagnosis not present

## 2017-11-14 DIAGNOSIS — J3089 Other allergic rhinitis: Secondary | ICD-10-CM | POA: Diagnosis not present

## 2017-11-14 DIAGNOSIS — J301 Allergic rhinitis due to pollen: Secondary | ICD-10-CM | POA: Diagnosis not present

## 2017-11-26 DIAGNOSIS — J301 Allergic rhinitis due to pollen: Secondary | ICD-10-CM | POA: Diagnosis not present

## 2017-11-26 DIAGNOSIS — J3089 Other allergic rhinitis: Secondary | ICD-10-CM | POA: Diagnosis not present

## 2017-11-26 DIAGNOSIS — J3081 Allergic rhinitis due to animal (cat) (dog) hair and dander: Secondary | ICD-10-CM | POA: Diagnosis not present

## 2017-11-29 DIAGNOSIS — J3089 Other allergic rhinitis: Secondary | ICD-10-CM | POA: Diagnosis not present

## 2017-11-29 DIAGNOSIS — J301 Allergic rhinitis due to pollen: Secondary | ICD-10-CM | POA: Diagnosis not present

## 2017-11-29 DIAGNOSIS — J3081 Allergic rhinitis due to animal (cat) (dog) hair and dander: Secondary | ICD-10-CM | POA: Diagnosis not present

## 2017-12-03 DIAGNOSIS — J3089 Other allergic rhinitis: Secondary | ICD-10-CM | POA: Diagnosis not present

## 2017-12-03 DIAGNOSIS — J3081 Allergic rhinitis due to animal (cat) (dog) hair and dander: Secondary | ICD-10-CM | POA: Diagnosis not present

## 2017-12-03 DIAGNOSIS — J301 Allergic rhinitis due to pollen: Secondary | ICD-10-CM | POA: Diagnosis not present

## 2017-12-05 DIAGNOSIS — J301 Allergic rhinitis due to pollen: Secondary | ICD-10-CM | POA: Diagnosis not present

## 2017-12-05 DIAGNOSIS — J3081 Allergic rhinitis due to animal (cat) (dog) hair and dander: Secondary | ICD-10-CM | POA: Diagnosis not present

## 2017-12-05 DIAGNOSIS — J3089 Other allergic rhinitis: Secondary | ICD-10-CM | POA: Diagnosis not present

## 2017-12-11 DIAGNOSIS — M7541 Impingement syndrome of right shoulder: Secondary | ICD-10-CM | POA: Diagnosis not present

## 2017-12-11 DIAGNOSIS — M7542 Impingement syndrome of left shoulder: Secondary | ICD-10-CM | POA: Diagnosis not present

## 2017-12-11 DIAGNOSIS — M7501 Adhesive capsulitis of right shoulder: Secondary | ICD-10-CM | POA: Diagnosis not present

## 2017-12-13 DIAGNOSIS — J3089 Other allergic rhinitis: Secondary | ICD-10-CM | POA: Diagnosis not present

## 2017-12-13 DIAGNOSIS — J301 Allergic rhinitis due to pollen: Secondary | ICD-10-CM | POA: Diagnosis not present

## 2017-12-13 DIAGNOSIS — Z808 Family history of malignant neoplasm of other organs or systems: Secondary | ICD-10-CM | POA: Diagnosis not present

## 2017-12-13 DIAGNOSIS — D1800 Hemangioma unspecified site: Secondary | ICD-10-CM | POA: Diagnosis not present

## 2017-12-13 DIAGNOSIS — Z86018 Personal history of other benign neoplasm: Secondary | ICD-10-CM | POA: Diagnosis not present

## 2017-12-13 DIAGNOSIS — L570 Actinic keratosis: Secondary | ICD-10-CM | POA: Diagnosis not present

## 2017-12-13 DIAGNOSIS — J3081 Allergic rhinitis due to animal (cat) (dog) hair and dander: Secondary | ICD-10-CM | POA: Diagnosis not present

## 2017-12-17 DIAGNOSIS — M7501 Adhesive capsulitis of right shoulder: Secondary | ICD-10-CM | POA: Diagnosis not present

## 2017-12-17 DIAGNOSIS — M7521 Bicipital tendinitis, right shoulder: Secondary | ICD-10-CM | POA: Diagnosis not present

## 2017-12-17 DIAGNOSIS — M7541 Impingement syndrome of right shoulder: Secondary | ICD-10-CM | POA: Diagnosis not present

## 2017-12-19 DIAGNOSIS — J3081 Allergic rhinitis due to animal (cat) (dog) hair and dander: Secondary | ICD-10-CM | POA: Diagnosis not present

## 2017-12-19 DIAGNOSIS — J301 Allergic rhinitis due to pollen: Secondary | ICD-10-CM | POA: Diagnosis not present

## 2017-12-19 DIAGNOSIS — J3089 Other allergic rhinitis: Secondary | ICD-10-CM | POA: Diagnosis not present

## 2017-12-27 DIAGNOSIS — M7521 Bicipital tendinitis, right shoulder: Secondary | ICD-10-CM | POA: Diagnosis not present

## 2017-12-27 DIAGNOSIS — M7501 Adhesive capsulitis of right shoulder: Secondary | ICD-10-CM | POA: Diagnosis not present

## 2017-12-27 DIAGNOSIS — M7541 Impingement syndrome of right shoulder: Secondary | ICD-10-CM | POA: Diagnosis not present

## 2017-12-31 DIAGNOSIS — J301 Allergic rhinitis due to pollen: Secondary | ICD-10-CM | POA: Diagnosis not present

## 2017-12-31 DIAGNOSIS — J3089 Other allergic rhinitis: Secondary | ICD-10-CM | POA: Diagnosis not present

## 2017-12-31 DIAGNOSIS — J3081 Allergic rhinitis due to animal (cat) (dog) hair and dander: Secondary | ICD-10-CM | POA: Diagnosis not present

## 2018-01-02 DIAGNOSIS — M7501 Adhesive capsulitis of right shoulder: Secondary | ICD-10-CM | POA: Diagnosis not present

## 2018-01-02 DIAGNOSIS — M7521 Bicipital tendinitis, right shoulder: Secondary | ICD-10-CM | POA: Diagnosis not present

## 2018-01-02 DIAGNOSIS — J301 Allergic rhinitis due to pollen: Secondary | ICD-10-CM | POA: Diagnosis not present

## 2018-01-02 DIAGNOSIS — M7541 Impingement syndrome of right shoulder: Secondary | ICD-10-CM | POA: Diagnosis not present

## 2018-01-02 DIAGNOSIS — J3089 Other allergic rhinitis: Secondary | ICD-10-CM | POA: Diagnosis not present

## 2018-01-02 DIAGNOSIS — J3081 Allergic rhinitis due to animal (cat) (dog) hair and dander: Secondary | ICD-10-CM | POA: Diagnosis not present

## 2018-01-07 DIAGNOSIS — J301 Allergic rhinitis due to pollen: Secondary | ICD-10-CM | POA: Diagnosis not present

## 2018-01-07 DIAGNOSIS — J3089 Other allergic rhinitis: Secondary | ICD-10-CM | POA: Diagnosis not present

## 2018-01-07 DIAGNOSIS — J3081 Allergic rhinitis due to animal (cat) (dog) hair and dander: Secondary | ICD-10-CM | POA: Diagnosis not present

## 2018-01-08 DIAGNOSIS — M7501 Adhesive capsulitis of right shoulder: Secondary | ICD-10-CM | POA: Diagnosis not present

## 2018-01-08 DIAGNOSIS — M7541 Impingement syndrome of right shoulder: Secondary | ICD-10-CM | POA: Diagnosis not present

## 2018-01-08 DIAGNOSIS — M7521 Bicipital tendinitis, right shoulder: Secondary | ICD-10-CM | POA: Diagnosis not present

## 2018-01-10 DIAGNOSIS — J3089 Other allergic rhinitis: Secondary | ICD-10-CM | POA: Diagnosis not present

## 2018-01-10 DIAGNOSIS — J301 Allergic rhinitis due to pollen: Secondary | ICD-10-CM | POA: Diagnosis not present

## 2018-01-10 DIAGNOSIS — J3081 Allergic rhinitis due to animal (cat) (dog) hair and dander: Secondary | ICD-10-CM | POA: Diagnosis not present

## 2018-01-14 DIAGNOSIS — J3089 Other allergic rhinitis: Secondary | ICD-10-CM | POA: Diagnosis not present

## 2018-01-14 DIAGNOSIS — J301 Allergic rhinitis due to pollen: Secondary | ICD-10-CM | POA: Diagnosis not present

## 2018-01-14 DIAGNOSIS — J3081 Allergic rhinitis due to animal (cat) (dog) hair and dander: Secondary | ICD-10-CM | POA: Diagnosis not present

## 2018-01-16 DIAGNOSIS — J3089 Other allergic rhinitis: Secondary | ICD-10-CM | POA: Diagnosis not present

## 2018-01-16 DIAGNOSIS — J301 Allergic rhinitis due to pollen: Secondary | ICD-10-CM | POA: Diagnosis not present

## 2018-01-16 DIAGNOSIS — J3081 Allergic rhinitis due to animal (cat) (dog) hair and dander: Secondary | ICD-10-CM | POA: Diagnosis not present

## 2018-01-22 DIAGNOSIS — J3089 Other allergic rhinitis: Secondary | ICD-10-CM | POA: Diagnosis not present

## 2018-01-22 DIAGNOSIS — J301 Allergic rhinitis due to pollen: Secondary | ICD-10-CM | POA: Diagnosis not present

## 2018-01-22 DIAGNOSIS — J3081 Allergic rhinitis due to animal (cat) (dog) hair and dander: Secondary | ICD-10-CM | POA: Diagnosis not present

## 2018-01-28 DIAGNOSIS — J3081 Allergic rhinitis due to animal (cat) (dog) hair and dander: Secondary | ICD-10-CM | POA: Diagnosis not present

## 2018-01-28 DIAGNOSIS — J3089 Other allergic rhinitis: Secondary | ICD-10-CM | POA: Diagnosis not present

## 2018-01-28 DIAGNOSIS — J301 Allergic rhinitis due to pollen: Secondary | ICD-10-CM | POA: Diagnosis not present

## 2018-01-29 DIAGNOSIS — M7521 Bicipital tendinitis, right shoulder: Secondary | ICD-10-CM | POA: Diagnosis not present

## 2018-01-29 DIAGNOSIS — M7541 Impingement syndrome of right shoulder: Secondary | ICD-10-CM | POA: Diagnosis not present

## 2018-01-29 DIAGNOSIS — M7501 Adhesive capsulitis of right shoulder: Secondary | ICD-10-CM | POA: Diagnosis not present

## 2018-02-05 DIAGNOSIS — J3081 Allergic rhinitis due to animal (cat) (dog) hair and dander: Secondary | ICD-10-CM | POA: Diagnosis not present

## 2018-02-05 DIAGNOSIS — J3089 Other allergic rhinitis: Secondary | ICD-10-CM | POA: Diagnosis not present

## 2018-02-05 DIAGNOSIS — J301 Allergic rhinitis due to pollen: Secondary | ICD-10-CM | POA: Diagnosis not present

## 2018-02-20 DIAGNOSIS — J3081 Allergic rhinitis due to animal (cat) (dog) hair and dander: Secondary | ICD-10-CM | POA: Diagnosis not present

## 2018-02-20 DIAGNOSIS — J301 Allergic rhinitis due to pollen: Secondary | ICD-10-CM | POA: Diagnosis not present

## 2018-02-20 DIAGNOSIS — J45990 Exercise induced bronchospasm: Secondary | ICD-10-CM | POA: Diagnosis not present

## 2018-02-20 DIAGNOSIS — J3089 Other allergic rhinitis: Secondary | ICD-10-CM | POA: Diagnosis not present

## 2018-02-25 DIAGNOSIS — M7501 Adhesive capsulitis of right shoulder: Secondary | ICD-10-CM | POA: Diagnosis not present

## 2018-02-28 DIAGNOSIS — J3089 Other allergic rhinitis: Secondary | ICD-10-CM | POA: Diagnosis not present

## 2018-02-28 DIAGNOSIS — J301 Allergic rhinitis due to pollen: Secondary | ICD-10-CM | POA: Diagnosis not present

## 2018-02-28 DIAGNOSIS — J3081 Allergic rhinitis due to animal (cat) (dog) hair and dander: Secondary | ICD-10-CM | POA: Diagnosis not present

## 2018-03-15 DIAGNOSIS — L570 Actinic keratosis: Secondary | ICD-10-CM | POA: Diagnosis not present

## 2018-03-21 DIAGNOSIS — M7501 Adhesive capsulitis of right shoulder: Secondary | ICD-10-CM | POA: Diagnosis not present

## 2018-03-21 DIAGNOSIS — M7521 Bicipital tendinitis, right shoulder: Secondary | ICD-10-CM | POA: Diagnosis not present

## 2018-03-21 DIAGNOSIS — M7541 Impingement syndrome of right shoulder: Secondary | ICD-10-CM | POA: Diagnosis not present

## 2018-03-22 DIAGNOSIS — J301 Allergic rhinitis due to pollen: Secondary | ICD-10-CM | POA: Diagnosis not present

## 2018-03-22 DIAGNOSIS — J3089 Other allergic rhinitis: Secondary | ICD-10-CM | POA: Diagnosis not present

## 2018-03-22 DIAGNOSIS — J3081 Allergic rhinitis due to animal (cat) (dog) hair and dander: Secondary | ICD-10-CM | POA: Diagnosis not present

## 2018-04-05 DIAGNOSIS — M7541 Impingement syndrome of right shoulder: Secondary | ICD-10-CM | POA: Diagnosis not present

## 2018-04-05 DIAGNOSIS — M7521 Bicipital tendinitis, right shoulder: Secondary | ICD-10-CM | POA: Diagnosis not present

## 2018-04-05 DIAGNOSIS — M7501 Adhesive capsulitis of right shoulder: Secondary | ICD-10-CM | POA: Diagnosis not present

## 2018-04-09 DIAGNOSIS — J301 Allergic rhinitis due to pollen: Secondary | ICD-10-CM | POA: Diagnosis not present

## 2018-04-09 DIAGNOSIS — J3081 Allergic rhinitis due to animal (cat) (dog) hair and dander: Secondary | ICD-10-CM | POA: Diagnosis not present

## 2018-04-09 DIAGNOSIS — J3089 Other allergic rhinitis: Secondary | ICD-10-CM | POA: Diagnosis not present

## 2018-04-22 DIAGNOSIS — M7501 Adhesive capsulitis of right shoulder: Secondary | ICD-10-CM | POA: Diagnosis not present

## 2018-05-01 DIAGNOSIS — J301 Allergic rhinitis due to pollen: Secondary | ICD-10-CM | POA: Diagnosis not present

## 2018-05-01 DIAGNOSIS — J3089 Other allergic rhinitis: Secondary | ICD-10-CM | POA: Diagnosis not present

## 2018-05-01 DIAGNOSIS — J3081 Allergic rhinitis due to animal (cat) (dog) hair and dander: Secondary | ICD-10-CM | POA: Diagnosis not present

## 2018-05-03 DIAGNOSIS — J301 Allergic rhinitis due to pollen: Secondary | ICD-10-CM | POA: Diagnosis not present

## 2018-05-03 DIAGNOSIS — Z23 Encounter for immunization: Secondary | ICD-10-CM | POA: Diagnosis not present

## 2018-05-03 DIAGNOSIS — J3081 Allergic rhinitis due to animal (cat) (dog) hair and dander: Secondary | ICD-10-CM | POA: Diagnosis not present

## 2018-05-06 DIAGNOSIS — J3089 Other allergic rhinitis: Secondary | ICD-10-CM | POA: Diagnosis not present

## 2018-05-07 DIAGNOSIS — J45990 Exercise induced bronchospasm: Secondary | ICD-10-CM | POA: Diagnosis not present

## 2018-05-07 DIAGNOSIS — J301 Allergic rhinitis due to pollen: Secondary | ICD-10-CM | POA: Diagnosis not present

## 2018-05-07 DIAGNOSIS — R05 Cough: Secondary | ICD-10-CM | POA: Diagnosis not present

## 2018-05-07 DIAGNOSIS — J3089 Other allergic rhinitis: Secondary | ICD-10-CM | POA: Diagnosis not present

## 2018-05-27 DIAGNOSIS — J3081 Allergic rhinitis due to animal (cat) (dog) hair and dander: Secondary | ICD-10-CM | POA: Diagnosis not present

## 2018-05-27 DIAGNOSIS — J3089 Other allergic rhinitis: Secondary | ICD-10-CM | POA: Diagnosis not present

## 2018-05-27 DIAGNOSIS — J301 Allergic rhinitis due to pollen: Secondary | ICD-10-CM | POA: Diagnosis not present

## 2018-06-12 DIAGNOSIS — G4700 Insomnia, unspecified: Secondary | ICD-10-CM | POA: Diagnosis not present

## 2018-06-12 DIAGNOSIS — Z6823 Body mass index (BMI) 23.0-23.9, adult: Secondary | ICD-10-CM | POA: Diagnosis not present

## 2018-07-22 DIAGNOSIS — J301 Allergic rhinitis due to pollen: Secondary | ICD-10-CM | POA: Diagnosis not present

## 2018-07-22 DIAGNOSIS — J0100 Acute maxillary sinusitis, unspecified: Secondary | ICD-10-CM | POA: Diagnosis not present

## 2018-07-22 DIAGNOSIS — H1045 Other chronic allergic conjunctivitis: Secondary | ICD-10-CM | POA: Diagnosis not present

## 2018-07-22 DIAGNOSIS — J3089 Other allergic rhinitis: Secondary | ICD-10-CM | POA: Diagnosis not present

## 2018-07-22 DIAGNOSIS — J45990 Exercise induced bronchospasm: Secondary | ICD-10-CM | POA: Diagnosis not present

## 2019-07-01 ENCOUNTER — Ambulatory Visit (INDEPENDENT_AMBULATORY_CARE_PROVIDER_SITE_OTHER): Payer: 59 | Admitting: Plastic Surgery

## 2019-07-01 ENCOUNTER — Other Ambulatory Visit: Payer: Self-pay

## 2019-07-01 ENCOUNTER — Encounter: Payer: Self-pay | Admitting: Plastic Surgery

## 2019-07-01 DIAGNOSIS — R252 Cramp and spasm: Secondary | ICD-10-CM | POA: Insufficient documentation

## 2019-07-01 NOTE — Progress Notes (Signed)
Patient ID: Jennifer Douglas, female    DOB: 02/02/1971, 48 y.o.   MRN: [id number]   Chief Complaint  Patient presents with  . Advice Only    The patient is a 48 year old female here for evaluation of her trismus.  She has been dealing with this for over a year.  She had splints made by her dentist.  She denies any grinding.  She states that it is becoming quite bothersome to her.  She feels the tightness in her face and in her cheek muscles.  She has been under a lot of stress with Covid but not particularly anything more than usual.  She has done some research and is interested in botulinum toxin treatment.  She is otherwise in good health and not had any traumatic injury to the area.   Review of Systems  Constitutional: Negative.   HENT: Negative.   Eyes: Negative.   Respiratory: Negative.   Cardiovascular: Negative.   Gastrointestinal: Negative.   Endocrine: Negative.   Genitourinary: Negative.   Musculoskeletal: Negative.   Skin: Negative.   Neurological: Negative.   Psychiatric/Behavioral: Negative.     Past Medical History:  Diagnosis Date  . History of basal cell cancer    Right forearm (Dr Delman Cheadle)  . Insomnia   . Rupture of left rectus femoris tendon    10/2008 s/p Pt   . Seasonal allergies    Dr Criselda Peaches   . Varicose veins of right leg with edema   . Vitamin D deficiency    05/2009    History reviewed. No pertinent surgical history.    Current Outpatient Medications:  .  ALPRAZolam (XANAX) 0.25 MG tablet, Take 0.25 mg by mouth at bedtime as needed for anxiety., Disp: , Rfl:  .  Calcium-Vitamin D (CALTRATE 600 PLUS-VIT D PO), Take by mouth., Disp: , Rfl:  .  diphenhydrAMINE (SOMINEX) 25 MG tablet, Take 25 mg by mouth at bedtime as needed for sleep., Disp: , Rfl:  .  eszopiclone (LUNESTA) 1 MG TABS tablet, Take 1 mg by mouth at bedtime as needed for sleep. Take immediately before bedtime, Disp: , Rfl:  .  mometasone (ASMANEX) 220 MCG/INH inhaler, Inhale 2  puffs into the lungs daily., Disp: , Rfl:  .  mometasone (NASONEX) 50 MCG/ACT nasal spray, Place 2 sprays into the nose daily., Disp: , Rfl:  .  Montelukast Sodium (SINGULAIR PO), Take by mouth., Disp: , Rfl:  .  Olopatadine HCl (PATADAY OP), Apply to eye. 1 drop in affected eye once a day, Disp: , Rfl:    Objective:   There were no vitals filed for this visit.  Physical Exam Vitals signs and nursing note reviewed.  Constitutional:      Appearance: Normal appearance.  HENT:     Head: Normocephalic.  Pulmonary:     Effort: Pulmonary effort is normal.  Abdominal:     General: Abdomen is flat. There is no distension.  Skin:    Capillary Refill: Capillary refill takes less than 2 seconds.  Neurological:     General: No focal deficit present.     Mental Status: She is alert and oriented to person, place, and time.  Psychiatric:        Mood and Affect: Mood normal.        Behavior: Behavior normal.        Thought Content: Thought content normal.     Assessment & Plan:  Trismus  I would like to do some research  about the treatment of trismus with Botox.  I also would like to talk with Dr. Mancel Parsons.  I think further evaluation of her temporomandibular joint and any other underlying causes would be helpful.  I think Dr. Mancel Parsons has experience in this area and would appreciate his clinical input.  The patient is open to this idea and I will talk with Dr. Mancel Parsons and will see if we can get this arranged.  I spoke with Dr. Mancel Parsons about the above information and he is in agreement to see the patient.  Consult placed. [place], DO

## 2019-08-04 ENCOUNTER — Telehealth: Payer: Self-pay | Admitting: Plastic Surgery

## 2019-08-04 NOTE — Telephone Encounter (Signed)

## 2019-08-05 ENCOUNTER — Encounter: Payer: Self-pay | Admitting: Plastic Surgery

## 2019-08-05 ENCOUNTER — Other Ambulatory Visit: Payer: Self-pay

## 2019-08-05 ENCOUNTER — Ambulatory Visit (INDEPENDENT_AMBULATORY_CARE_PROVIDER_SITE_OTHER): Payer: Self-pay | Admitting: Plastic Surgery

## 2019-08-05 DIAGNOSIS — Z719 Counseling, unspecified: Secondary | ICD-10-CM

## 2019-08-05 NOTE — Progress Notes (Signed)
Filler Injection Procedure Note  Procedure:  Filler administration  Pre-operative Diagnosis: Rytides and midface volume loss  Post-operative Diagnosis: Same  Surgeon: Electronically signed by: Theodoro Kos, DO   Complications:  None  Brief history: The patient desires injection with fillers in her face. I discussed with the patient this proposed procedure of filler injections, which is customized depending on the particular needs of the patient. It is performed on facial volume loss as a temporary correction. The alternatives were discussed with the patient. The risks were addressed including bleeding, scarring, infection, damage to deeper structures, asymmetry, and chronic pain, which may occur infrequently after a procedure. The individual's choice to undergo a surgical procedure is based on the comparison of risks to potential benefits. Other risks include unsatisfactory results, allergic reaction, which should go away with time, bruising and delayed healing. Fillers do not arrest the aging process or produce permanent tightening.  Operative intervention maybe necessary to maintain the results. The patient understands and wishes to proceed. An informed consent was signed and informational brochures given to her prior to the procedure.  Procedure: The area was prepped with chlorhexidine and dried with a clean gauze. Using a clean technique, a 30 gauge needle was then used to inject the filler. The midface area was injected at the medial sub-region of the mid-face. One syringe on each side. No complications were noted. Light pressure was held for 5 minutes. She was instructed explicitly in post-operative care.  Restylane Lyft x 2  LOT: [id number] EXP: 2021-12-28

## 2019-10-10 ENCOUNTER — Ambulatory Visit: Payer: 59 | Attending: Internal Medicine

## 2019-10-10 DIAGNOSIS — Z23 Encounter for immunization: Secondary | ICD-10-CM

## 2019-10-10 NOTE — Progress Notes (Signed)
   Covid-19 Vaccination Clinic  Name:  Jennifer Douglas    MRN: [id number] DOB: [birth year]  10/10/2019  [name] was observed post Covid-19 immunization for 15 minutes without incident. She was provided with Vaccine Information Sheet and instruction to access the V-Safe system.   [name] was instructed to call 911 with any severe reactions post vaccine: Marland Kitchen Difficulty breathing  . Swelling of face and throat  . A fast heartbeat  . A bad rash all over body  . Dizziness and weakness   Immunizations Administered    Name Date Dose VIS Date Route   Pfizer COVID-19 Vaccine 10/10/2019  1:23 PM 0.3 mL 07/11/2019 Intramuscular   Manufacturer: [place]   Lot: [id number]   [place]: [id number]

## 2019-11-05 ENCOUNTER — Ambulatory Visit: Payer: 59 | Attending: Internal Medicine

## 2019-11-05 DIAGNOSIS — Z23 Encounter for immunization: Secondary | ICD-10-CM

## 2019-11-05 NOTE — Progress Notes (Signed)
   Covid-19 Vaccination Clinic  Name:  Jennifer Douglas    MRN: [id number] DOB: Oct 01, 1970  11/05/2019  Ms. Jennifer Douglas was observed post Covid-19 immunization for 15 minutes without incident. She was provided with Vaccine Information Sheet and instruction to access the V-Safe system.   Ms. Jennifer Douglas was instructed to call 911 with any severe reactions post vaccine: Marland Kitchen Difficulty breathing  . Swelling of face and throat  . A fast heartbeat  . A bad rash all over body  . Dizziness and weakness   Immunizations Administered    Name Date Dose VIS Date Route   Pfizer COVID-19 Vaccine 11/05/2019 12:00 PM 0.3 mL 07/11/2019 Intramuscular   Manufacturer: [place]   Lot: [id number]   [place]: [id number]

## 2021-01-06 LAB — COLOGUARD: COLOGUARD: NEGATIVE

## 2022-04-10 ENCOUNTER — Other Ambulatory Visit: Payer: Self-pay | Admitting: Obstetrics and Gynecology

## 2022-04-10 DIAGNOSIS — R928 Other abnormal and inconclusive findings on diagnostic imaging of breast: Secondary | ICD-10-CM

## 2022-04-24 ENCOUNTER — Other Ambulatory Visit: Payer: Self-pay | Admitting: Obstetrics and Gynecology

## 2022-04-24 ENCOUNTER — Ambulatory Visit
Admission: RE | Admit: 2022-04-24 | Discharge: 2022-04-24 | Disposition: A | Discharge status: Home / Self Care | Payer: No Typology Code available for payment source | Source: Ambulatory Visit | Attending: Obstetrics and Gynecology | Admitting: Obstetrics and Gynecology

## 2022-04-24 ENCOUNTER — Ambulatory Visit
Admission: RE | Admit: 2022-04-24 | Discharge: 2022-04-24 | Disposition: A | Discharge status: Home / Self Care | Payer: 59 | Source: Ambulatory Visit | Attending: Obstetrics and Gynecology | Admitting: Obstetrics and Gynecology

## 2022-04-24 DIAGNOSIS — R928 Other abnormal and inconclusive findings on diagnostic imaging of breast: Secondary | ICD-10-CM

## 2022-04-24 DIAGNOSIS — N6320 Unspecified lump in the left breast, unspecified quadrant: Secondary | ICD-10-CM

## 2022-04-26 ENCOUNTER — Ambulatory Visit
Admission: RE | Admit: 2022-04-26 | Discharge: 2022-04-26 | Disposition: A | Discharge status: Home / Self Care | Payer: No Typology Code available for payment source | Source: Ambulatory Visit | Attending: Obstetrics and Gynecology | Admitting: Obstetrics and Gynecology

## 2022-04-26 DIAGNOSIS — N6320 Unspecified lump in the left breast, unspecified quadrant: Secondary | ICD-10-CM

## 2024-01-23 LAB — COLOGUARD: COLOGUARD: POSITIVE — AB

## 2024-01-28 ENCOUNTER — Encounter: Payer: Self-pay | Admitting: Gastroenterology

## 2024-02-06 ENCOUNTER — Ambulatory Visit

## 2024-02-06 VITALS — Ht 62.0 in | Wt 125.0 lb

## 2024-02-06 DIAGNOSIS — R195 Other fecal abnormalities: Secondary | ICD-10-CM

## 2024-02-06 MED ORDER — NA SULFATE-K SULFATE-MG SULF 17.5-3.13-1.6 GM/177ML PO SOLN: 1.0000 | Freq: Once | ORAL | 0 refills | Status: AC

## 2024-02-06 NOTE — Progress Notes (Signed)
 No egg or soy allergy known to patient  No issues known to pt with past sedation with any surgeries or procedures Patient denies ever being told they had issues or difficulty with intubation  No FH of Malignant Hyperthermia Pt is not on diet pills nor GLP-1 medications Pt is not on  home 02  Pt is not on blood thinners  Pt denies issues with chronic constipation  No A fib or A flutter Have any cardiac testing pending--no Pt instructed to use Singlecare.com or GoodRx for a price reduction on prep  Ambulates independently

## 2024-02-12 ENCOUNTER — Encounter: Payer: Self-pay | Admitting: Gastroenterology

## 2024-02-27 ENCOUNTER — Encounter: Admitting: Gastroenterology

## 2024-03-17 ENCOUNTER — Telehealth: Payer: Self-pay | Admitting: Gastroenterology

## 2024-03-17 NOTE — Telephone Encounter (Signed)
 Returned pt's call. Answered all questions regarding prep.

## 2024-03-17 NOTE — Telephone Encounter (Signed)
 Patient requesting f/u call in regards to prep instructions.

## 2024-03-18 ENCOUNTER — Encounter: Payer: Self-pay | Admitting: Gastroenterology

## 2024-03-18 ENCOUNTER — Ambulatory Visit (AMBULATORY_SURGERY_CENTER): Admitting: Gastroenterology

## 2024-03-18 VITALS — BP 109/55 | HR 58 | Temp 97.5°F | Resp 12 | Ht 62.0 in | Wt 125.0 lb

## 2024-03-18 DIAGNOSIS — K5730 Diverticulosis of large intestine without perforation or abscess without bleeding: Secondary | ICD-10-CM

## 2024-03-18 DIAGNOSIS — R195 Other fecal abnormalities: Secondary | ICD-10-CM

## 2024-03-18 DIAGNOSIS — K648 Other hemorrhoids: Secondary | ICD-10-CM

## 2024-03-18 DIAGNOSIS — Z1211 Encounter for screening for malignant neoplasm of colon: Secondary | ICD-10-CM

## 2024-03-18 DIAGNOSIS — K644 Residual hemorrhoidal skin tags: Secondary | ICD-10-CM | POA: Diagnosis not present

## 2024-03-18 MED ORDER — SODIUM CHLORIDE 0.9 % IV SOLN
500.0000 mL | Freq: Once | INTRAVENOUS | Status: DC
Start: 2024-03-18 — End: 2024-03-18

## 2024-03-18 NOTE — Progress Notes (Unsigned)
 [facility] History and Physical   Primary Care Physician:  Stephane Leita DEL, MD   Reason for Procedure:  Positive cologaurd  Plan:    colonoscopy with possible interventions as needed     HPI: Jennifer Douglas is a very pleasant 53 y.o. female here for colonoscopy for evaluation of positive Cologuard.  The risks and benefits as well as alternatives of endoscopic procedure(s) have been discussed and reviewed. All questions answered. The patient agrees to proceed.    Past Medical History:  Diagnosis Date   Allergy    Asthma    History of basal cell cancer    Right forearm (Dr Robinson)   Insomnia    Rupture of left rectus femoris tendon    10/2008 s/p Pt    Seasonal allergies    Dr Gearld    Varicose veins of right leg with edema    Vitamin D deficiency    05/2009    History reviewed. No pertinent surgical history.  Prior to Admission medications   Medication Sig Start Date End Date Taking? Authorizing Provider  budesonide-formoterol (SYMBICORT) 160-4.5 MCG/ACT inhaler Inhale 2 puffs into the lungs 2 (two) times daily.   Yes [provider]  cetirizine (ZYRTEC) 5 MG tablet Take 5 mg by mouth daily.   Yes [provider]  diphenhydrAMINE (SOMINEX) 25 MG tablet Take 25 mg by mouth at bedtime as needed for sleep.   Yes [provider]  estradiol (VIVELLE-DOT) 0.0375 MG/24HR APPLY 1 PATCH TOPICALLY TO THE SKIN TWICE A WEEK   Yes [provider]  levonorgestrel (MIRENA, 52 MG,) 20 MCG/DAY IUD 1 each by Intrauterine route once. 02/21/16  Yes [provider]  VITAMIN D-VITAMIN K PO Take by mouth.   Yes [provider]  albuterol (VENTOLIN HFA) 108 (90 Base) MCG/ACT inhaler Inhale 1-2 puffs into the lungs every 4 (four) hours as needed.    [provider]  EPINEPHrine (EPIPEN 2-PAK) 0.3 mg/0.3 mL IJ SOAJ injection as directed Injection PRN SYSTEMIC REACTION Patient not taking: Reported on 03/18/2024 08/01/16    [provider]  eszopiclone (LUNESTA) 1 MG TABS tablet Take 1 mg by mouth at bedtime as needed for sleep. Take immediately before bedtime    [provider]  Olopatadine HCl (PATADAY OP) Apply to eye. 1 drop in affected eye once a day Patient not taking: Reported on 02/06/2024    [provider]    Current Outpatient Medications  Medication Sig Dispense Refill   budesonide-formoterol (SYMBICORT) 160-4.5 MCG/ACT inhaler Inhale 2 puffs into the lungs 2 (two) times daily.     cetirizine (ZYRTEC) 5 MG tablet Take 5 mg by mouth daily.     diphenhydrAMINE (SOMINEX) 25 MG tablet Take 25 mg by mouth at bedtime as needed for sleep.     estradiol (VIVELLE-DOT) 0.0375 MG/24HR APPLY 1 PATCH TOPICALLY TO THE SKIN TWICE A WEEK     levonorgestrel (MIRENA, 52 MG,) 20 MCG/DAY IUD 1 each by Intrauterine route once.     VITAMIN D-VITAMIN K PO Take by mouth.     albuterol (VENTOLIN HFA) 108 (90 Base) MCG/ACT inhaler Inhale 1-2 puffs into the lungs every 4 (four) hours as needed.     EPINEPHrine (EPIPEN 2-PAK) 0.3 mg/0.3 mL IJ SOAJ injection as directed Injection PRN SYSTEMIC REACTION (Patient not taking: Reported on 03/18/2024)     eszopiclone (LUNESTA) 1 MG TABS tablet Take 1 mg by mouth at bedtime as needed for sleep. Take immediately before bedtime  Olopatadine HCl (PATADAY OP) Apply to eye. 1 drop in affected eye once a day (Patient not taking: Reported on 02/06/2024)     Current Facility-Administered Medications  Medication Dose Route Frequency Provider Last Rate Last Admin   0.9 %  sodium chloride  infusion  500 mL Intravenous Once Tashiana Lamarca V, MD        Allergies as of 03/18/2024 - Review Complete 03/18/2024  Allergen Reaction Noted   Sulfa antibiotics Hives 02/06/2024   No known allergies Other (See Comments)     Family History  Problem Relation Age of Onset   Colon polyps Mother    Depression Mother    Osteoporosis Mother    Migraines Mother    CVA Paternal  Grandmother        tachycardia   Arrhythmia Paternal Grandmother    Colon cancer Neg Hx    Esophageal cancer Neg Hx    Rectal cancer Neg Hx    Stomach cancer Neg Hx     Social History   Socioeconomic History   Marital status: Married    Spouse name: Not on file   Number of children: Not on file   Years of education: Not on file   Highest education level: Not on file  Occupational History   Not on file  Tobacco Use   Smoking status: Never   Smokeless tobacco: Former  Advertising account planner   Vaping status: Never Used  Substance and Sexual Activity   Alcohol use: Yes    Alcohol/week: 1.0 standard drink of alcohol    Types: 1 Cans of beer per week   Drug use: No   Sexual activity: Not on file  Other Topics Concern   Not on file  Social History Narrative   Tobacco use  Cigarettes Never smoked   Tobacco history last updated 12/23/2013    No smoking   Alcohol : occasionally 2 drinks 1/ 2 drinks a week (weekends)   Caffeine coffee, 1 serving daily    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  Physical Exam: Vital signs in last 24 hours: BP 133/73   Pulse 64   Temp (!) 97.5 F (36.4 C)   Ht 5' 2 (1.575 m)   Wt 125 lb (56.7 kg)   SpO2 100%   BMI 22.86 kg/m  General:   Alert, NAD Lungs:  Clear .   Heart:  Regular rate and rhythm Abdomen:  Soft, nontender and nondistended. Neuro/Psych:  Alert and cooperative. Normal mood and affect. A and O x 3  Reviewed labs, radiology imaging, old records and  pertinent past GI work up  Patient is appropriate for planned procedure(s) and anesthesia in an ambulatory setting   K. Veena Navika Hoopes , MD [phone]

## 2024-03-18 NOTE — Progress Notes (Unsigned)
 Sedate, gd SR, tolerated procedure well, VSS, report to RN

## 2024-03-18 NOTE — Op Note (Signed)
 [facility] Patient Name: Jennifer Douglas Procedure Date: 03/18/2024 12:38 PM MRN: [id number] Endoscopist: Gustav ALONSO Mcgee , MD, [id number] Age: 53 Referring MD:  Date of Birth: [birth year] Gender: Female Account #: [id number] Procedure:                Colonoscopy Indications:              Positive Cologuard test Medicines:                Monitored Anesthesia Care Procedure:                Pre-Anesthesia Assessment:                           - Prior to the procedure, a History and Physical                            was performed, and patient medications and                            allergies were reviewed. The patient's tolerance of                            previous anesthesia was also reviewed. The risks                            and benefits of the procedure and the sedation                            options and risks were discussed with the patient.                            All questions were answered, and informed consent                            was obtained. Prior Anticoagulants: The patient has                            taken no anticoagulant or antiplatelet agents. ASA                            Grade Assessment: II - A patient with mild systemic                            disease. After reviewing the risks and benefits,                            the patient was deemed in satisfactory condition to                            undergo the procedure.                           After obtaining informed consent, the colonoscope  was passed under direct vision. Throughout the                            procedure, the patient's blood pressure, pulse, and                            oxygen saturations were monitored continuously. The                            Olympus Scope [id number] was introduced through the                            anus and advanced to the the terminal ileum, with                            identification of the  appendiceal orifice and IC                            valve. The colonoscopy was performed without                            difficulty. The patient tolerated the procedure                            well. The quality of the bowel preparation was                            good. The ileocecal valve, appendiceal orifice, and                            rectum were photographed. Scope In: 1:35:17 PM Scope Out: 1:55:03 PM Scope Withdrawal Time: 0 hours 16 minutes 3 seconds  Total Procedure Duration: 0 hours 19 minutes 46 seconds  Findings:                 The perianal and digital rectal examinations were                            normal.                           The terminal ileum appeared normal.                           A few small-mouthed diverticula were found in the                            sigmoid colon.                           Non-bleeding external and internal hemorrhoids were                            found during retroflexion. The hemorrhoids were  medium-sized. Complications:            No immediate complications. Estimated Blood Loss:     Estimated blood loss was minimal. Impression:               - The examined portion of the ileum was normal.                           - Diverticulosis in the sigmoid colon.                           - Non-bleeding external and internal hemorrhoids.                           - No specimens collected. Recommendation:           - Patient has a contact number available for                            emergencies. The signs and symptoms of potential                            delayed complications were discussed with the                            patient. Return to normal activities tomorrow.                            Written discharge instructions were provided to the                            patient.                           - Resume previous diet.                           - Continue present medications.                            - Repeat colonoscopy in 10 years for surveillance                            based on pathology results. Darcey Demma V. Dianah Pruett, MD 03/18/2024 2:01:34 PM This report has been signed electronically.

## 2024-03-18 NOTE — Patient Instructions (Signed)

## 2024-03-18 NOTE — Progress Notes (Unsigned)
 VS by [facility]  Pt's states no medical or surgical changes since previsit or office visit.

## 2024-03-19 ENCOUNTER — Telehealth: Payer: Self-pay | Admitting: Lactation Services

## 2024-03-19 NOTE — Telephone Encounter (Signed)
 No answer left voice mail
